# Patient Record
Sex: Female | Born: 1950 | Race: White | Hispanic: No | Marital: Single | Smoking: Never smoker
Health system: Southern US, Community
[De-identification: ages and names within clinical notes are randomized; demographics above are authoritative.]

## PROBLEM LIST (undated history)

## (undated) DIAGNOSIS — J45909 Unspecified asthma, uncomplicated: Secondary | ICD-10-CM

---

## 2014-09-25 ENCOUNTER — Encounter: Payer: Self-pay | Admitting: *Deleted

## 2014-09-25 ENCOUNTER — Ambulatory Visit
Admission: EM | Admit: 2014-09-25 | Discharge: 2014-09-25 | Disposition: A | Discharge status: Home / Self Care | Payer: No Typology Code available for payment source | Attending: Family Medicine | Admitting: Family Medicine

## 2014-09-25 DIAGNOSIS — H6091 Unspecified otitis externa, right ear: Secondary | ICD-10-CM

## 2014-09-25 DIAGNOSIS — J0100 Acute maxillary sinusitis, unspecified: Secondary | ICD-10-CM | POA: Diagnosis not present

## 2014-09-25 HISTORY — DX: Unspecified asthma, uncomplicated: J45.909

## 2014-09-25 MED ORDER — CIPROFLOXACIN-DEXAMETHASONE 0.3-0.1 % OT SUSP: 4.0000 [drp] | Freq: Two times a day (BID) | OTIC | Status: AC

## 2014-09-25 MED ORDER — DOXYCYCLINE HYCLATE 100 MG PO CAPS: 100.0000 mg | ORAL_CAPSULE | Freq: Two times a day (BID) | ORAL | Status: AC

## 2014-09-25 NOTE — ED Provider Notes (Signed)
CSN: [id number]     Arrival date & time 09/25/14  1925 History   First MD Initiated Contact with Patient 09/25/14 2004     Chief Complaint  Patient presents with  . Otalgia   (Consider location/radiation/quality/duration/timing/severity/associated sxs/prior Treatment) HPI  Patient presents for evaluation of right ear pain, which has been present for the past 2 days and is gradually worsening. Pain is worsened by pushing and pulling the ear. She also reports a slight decrease in hearing. She also complains of a 10 day history of sinus pain and sinus drainage. She denies any fever or chills. She has a history of recurrent sinus infections.  Past Medical History  Diagnosis Date  . Asthma    Past Surgical History  Procedure Laterality Date  . Cesarean section     No family history on file. History  Substance Use Topics  . Smoking status: Never Smoker   . Smokeless tobacco: Not on file  . Alcohol Use: No   OB History    No data available     Review of Systems  Constitutional: Negative for fever and chills.  HENT: Positive for congestion, ear pain and sinus pressure.   Respiratory: Negative for cough and shortness of breath.   Cardiovascular: Negative for chest pain.  Skin: Negative for rash.  Neurological: Positive for headaches.    Allergies  Cephalosporins; Penicillins; Sulfa antibiotics; and Tape  Home Medications   Prior to Admission medications   Medication Sig Start Date End Date Taking? Authorizing Provider  albuterol (PROVENTIL HFA;VENTOLIN HFA) 108 (90 BASE) MCG/ACT inhaler Inhale into the lungs every 6 (six) hours as needed for wheezing or shortness of breath.   Yes Historical Provider, MD  citalopram (CELEXA) 40 MG tablet Take 40 mg by mouth daily.   Yes Historical Provider, MD  montelukast (SINGULAIR) 10 MG tablet Take 10 mg by mouth at bedtime.   Yes Historical Provider, MD  Pseudoephedrine-Guaifenesin 30-200 MG CAPS Take by mouth.   Yes Historical Provider,  MD  TEMAZEPAM PO Take by mouth.   Yes Historical Provider, MD  zolpidem (AMBIEN) 10 MG tablet Take 10 mg by mouth at bedtime as needed for sleep.   Yes Historical Provider, MD  ciprofloxacin-dexamethasone (CIPRODEX) otic suspension Place 4 drops into the right ear 2 (two) times daily. 09/25/14   Carlean Purl, PA-C  doxycycline (VIBRAMYCIN) 100 MG capsule Take 1 capsule (100 mg total) by mouth 2 (two) times daily. 09/25/14   Carlean Purl, PA-C   BP 134/71 mmHg  Pulse 91  Temp(Src) 98 F (36.7 C) (Oral)  Ht  (1.575 m)  Wt 194 lb (87.998 kg)  BMI 35.47 kg/m2  SpO2 97% Physical Exam  Constitutional: She is oriented to person, place, and time. She appears well-developed and well-nourished.  HENT:  Right Ear: There is swelling and tenderness. A middle ear effusion is present.  Nose: Right sinus exhibits maxillary sinus tenderness and frontal sinus tenderness. Left sinus exhibits maxillary sinus tenderness and frontal sinus tenderness.  Cardiovascular: Normal rate and regular rhythm.   Pulmonary/Chest: Effort normal and breath sounds normal.  Neurological: She is alert and oriented to person, place, and time.  Skin: Skin is warm and dry.  Psychiatric: She has a normal mood and affect. Her behavior is normal.    ED Course  Procedures (including critical care time) Labs Review Labs Reviewed - No data to display  Imaging Review No results found.   MDM   1. Otitis externa of right  ear   2. Acute maxillary sinusitis, recurrence not specified    1. Patient was prescribed Ciprodex for otitis externa and doxycycline for sinusitis. 2. She will follow-up as needed.    Carlean Purlicole L Agatha Duplechain, PA-C 09/25/14 2013

## 2014-09-25 NOTE — ED Notes (Signed)
Pt states "right earache, had a cold about 2 weeks ago, i sleep on my right ear, my head is stopped up off and on all week, ear started hurting about the beginning of the week"

## 2017-06-04 ENCOUNTER — Other Ambulatory Visit: Payer: Self-pay | Admitting: Orthopedic Surgery

## 2017-06-04 DIAGNOSIS — S52134D Nondisplaced fracture of neck of right radius, subsequent encounter for closed fracture with routine healing: Secondary | ICD-10-CM

## 2017-06-22 ENCOUNTER — Ambulatory Visit: Payer: No Typology Code available for payment source

## 2017-06-23 ENCOUNTER — Ambulatory Visit
Admission: RE | Admit: 2017-06-23 | Discharge: 2017-06-23 | Disposition: A | Discharge status: Home / Self Care | Payer: Medicare HMO | Source: Ambulatory Visit | Attending: Orthopedic Surgery | Admitting: Orthopedic Surgery

## 2017-06-23 DIAGNOSIS — M7711 Lateral epicondylitis, right elbow: Secondary | ICD-10-CM | POA: Diagnosis not present

## 2017-06-23 DIAGNOSIS — X58XXXD Exposure to other specified factors, subsequent encounter: Secondary | ICD-10-CM | POA: Insufficient documentation

## 2017-06-23 DIAGNOSIS — M25521 Pain in right elbow: Secondary | ICD-10-CM | POA: Insufficient documentation

## 2017-06-23 DIAGNOSIS — M7521 Bicipital tendinitis, right shoulder: Secondary | ICD-10-CM | POA: Insufficient documentation

## 2017-06-23 DIAGNOSIS — S52134D Nondisplaced fracture of neck of right radius, subsequent encounter for closed fracture with routine healing: Secondary | ICD-10-CM | POA: Insufficient documentation

## 2018-03-25 ENCOUNTER — Other Ambulatory Visit: Payer: Self-pay | Admitting: Internal Medicine

## 2018-03-25 DIAGNOSIS — Z1231 Encounter for screening mammogram for malignant neoplasm of breast: Secondary | ICD-10-CM

## 2018-03-30 ENCOUNTER — Encounter: Payer: Self-pay | Admitting: Radiology

## 2018-03-30 ENCOUNTER — Ambulatory Visit
Admission: RE | Admit: 2018-03-30 | Discharge: 2018-03-30 | Disposition: A | Discharge status: Home / Self Care | Payer: Medicare HMO | Source: Ambulatory Visit | Attending: Internal Medicine | Admitting: Internal Medicine

## 2018-03-30 DIAGNOSIS — Z1231 Encounter for screening mammogram for malignant neoplasm of breast: Secondary | ICD-10-CM | POA: Diagnosis not present

## 2019-04-14 ENCOUNTER — Ambulatory Visit
Admission: EM | Admit: 2019-04-14 | Discharge: 2019-04-14 | Disposition: A | Discharge status: Home / Self Care | Payer: Medicare HMO | Attending: Family Medicine | Admitting: Family Medicine

## 2019-04-14 ENCOUNTER — Ambulatory Visit (INDEPENDENT_AMBULATORY_CARE_PROVIDER_SITE_OTHER): Payer: Medicare HMO

## 2019-04-14 ENCOUNTER — Other Ambulatory Visit: Payer: Self-pay

## 2019-04-14 DIAGNOSIS — S7001XA Contusion of right hip, initial encounter: Secondary | ICD-10-CM

## 2019-04-14 DIAGNOSIS — R0789 Other chest pain: Secondary | ICD-10-CM

## 2019-04-14 DIAGNOSIS — M25551 Pain in right hip: Secondary | ICD-10-CM

## 2019-04-14 DIAGNOSIS — M25559 Pain in unspecified hip: Secondary | ICD-10-CM

## 2019-04-14 DIAGNOSIS — W01198A Fall on same level from slipping, tripping and stumbling with subsequent striking against other object, initial encounter: Secondary | ICD-10-CM

## 2019-04-14 DIAGNOSIS — W19XXXA Unspecified fall, initial encounter: Secondary | ICD-10-CM

## 2019-04-14 DIAGNOSIS — S20211A Contusion of right front wall of thorax, initial encounter: Secondary | ICD-10-CM

## 2019-04-14 MED ORDER — METAXALONE 800 MG PO TABS: 800.0000 mg | ORAL_TABLET | Freq: Three times a day (TID) | ORAL | 0 refills | Status: AC

## 2019-04-14 MED ORDER — HYDROCODONE-ACETAMINOPHEN 5-325 MG PO TABS: ORAL_TABLET | ORAL | 0 refills | Status: AC

## 2019-04-14 NOTE — ED Triage Notes (Signed)
Pt presents with c/o s/p fall this morning at home. She fell onto an air purifier on the back side of her right hip. She reports pain to her right hip, right ribs and into mid/lower abdomen. She denies any n/v, hematuria or other symptoms. She is able to ambulate but movement is painful.

## 2019-04-14 NOTE — Discharge Instructions (Signed)
Rest, ice, tyelnol/advil as needed

## 2019-04-14 NOTE — ED Provider Notes (Signed)
[facility]    CSN: [id number] Arrival date & time: 04/14/19  1752      History   Chief Complaint Chief Complaint  Patient presents with  . Fall    HPI Alexa Chase is a 69 y.o. female.   69 yo female with a c/o right hip and right rib pain since falling at home this morning. States she tripped in her kitchen and landed onto an air purifier and on the floor. Denies hitting her head or loss of consciousness.      Past Medical History:  Diagnosis Date  . Asthma     There are no problems to display for this patient.   Past Surgical History:  Procedure Laterality Date  . CESAREAN SECTION      OB History   No obstetric history on file.      Home Medications    Prior to Admission medications   Medication Sig Start Date End Date Taking? Authorizing Provider  albuterol (PROVENTIL HFA;VENTOLIN HFA) 108 (90 BASE) MCG/ACT inhaler Inhale into the lungs every 6 (six) hours as needed for wheezing or shortness of breath.   Yes [provider]  citalopram (CELEXA) 40 MG tablet Take 40 mg by mouth daily.   Yes [provider]  Cyanocobalamin 3000 MCG SUBL Place under the tongue.   Yes [provider]  diclofenac (VOLTAREN) 75 MG EC tablet Take by mouth. 03/30/19 03/29/20 Yes [provider]  Eszopiclone 3 MG TABS Take 3 mg by mouth at bedtime as needed. 04/06/19  Yes [provider]  SUMAtriptan (IMITREX) 50 MG tablet 1 prn headache then repeat in 1 hour as needed 05/25/13  Yes [provider]  TEMAZEPAM PO Take by mouth.   Yes [provider]  traZODone (DESYREL) 100 MG tablet Take 100 mg by mouth at bedtime. 03/30/19  Yes [provider]  ciprofloxacin-dexamethasone (CIPRODEX) otic suspension Place 4 drops into the right ear 2 (two) times daily. 09/25/14   Mathews Argyle, PA-C  doxycycline (VIBRAMYCIN) 100 MG capsule Take 1 capsule (100 mg total) by mouth 2 (two) times daily. 09/25/14   Mathews Argyle, PA-C  HYDROcodone-acetaminophen (NORCO/VICODIN) 5-325 MG tablet 1-2 tab po qd prn 04/14/19   Norval Gable, MD  metaxalone (SKELAXIN) 800 MG tablet Take 1 tablet (800 mg total) by mouth 3 (three) times daily. 04/14/19   Norval Gable, MD  montelukast (SINGULAIR) 10 MG tablet Take 10 mg by mouth at bedtime.    [provider]  Pseudoephedrine-Guaifenesin 30-200 MG CAPS Take by mouth.    [provider]  zolpidem (AMBIEN) 10 MG tablet Take 10 mg by mouth at bedtime as needed for sleep.    [provider]    Family History Family History  Problem Relation Age of Onset  . Colon cancer Mother   . Stroke Mother   . Hypertension Mother   . Multiple myeloma Father   . Hypertension Father   . Breast cancer Neg Hx     Social History Social History   Tobacco Use  . Smoking status: Never Smoker  . Smokeless tobacco: Never Used  Substance Use Topics  . Alcohol use: No  . Drug use: No     Allergies   Cephalosporins, Penicillins, Sulfa antibiotics, and Tape   Review of Systems Review of Systems   Physical Exam Triage Vital Signs ED Triage Vitals  Enc Vitals Group     BP 04/14/19 1810 137/73  Pulse Rate 04/14/19 1810 85     Resp --      Temp 04/14/19 1810 98.2 F (36.8 C)     Temp Source 04/14/19 1810 Oral     SpO2 04/14/19 1810 99 %     Weight 04/14/19 1804 201 lb (91.2 kg)     Height 04/14/19 1804 5' 2" (1.575 m)     Head Circumference --      Peak Flow --      Pain Score 04/14/19 1803 8     Pain Loc --      Pain Edu? --      Excl. in [place]? --    No data found.  Updated Vital Signs BP 137/73 (BP Location: Left Arm)   Pulse 85   Temp 98.2 F (36.8 C) (Oral)   Ht 5' 2" (1.575 m)   Wt 91.2 kg   SpO2 99%   BMI 36.76 kg/m   Visual Acuity Right Eye Distance:   Left Eye Distance:   Bilateral Distance:    Right Eye Near:   Left Eye Near:    Bilateral Near:     Physical Exam Vitals and nursing note reviewed.    Constitutional:      General: She is not in acute distress.    Appearance: She is not toxic-appearing or diaphoretic.  Cardiovascular:     Rate and Rhythm: Normal rate.  Pulmonary:     Effort: Pulmonary effort is normal. No respiratory distress.     Breath sounds: Normal breath sounds.  Abdominal:     General: Bowel sounds are normal. There is no distension.     Palpations: Abdomen is soft.     Tenderness: There is no abdominal tenderness.  Musculoskeletal:     Right hip: Tenderness and bony tenderness present. No deformity, lacerations or crepitus.     Comments: Right lower extremity neurovascularly intact  Neurological:     Mental Status: She is alert.      UC Treatments / Results  Labs (all labs ordered are listed, but only abnormal results are displayed) Labs Reviewed - No data to display  EKG   Radiology DG Ribs Unilateral W/Chest Right  Result Date: 04/14/2019 CLINICAL DATA:  Pain after fall EXAM: RIGHT RIBS AND CHEST - 3+ VIEW COMPARISON:  None. FINDINGS: No fracture or other bone lesions are seen involving the ribs. There is no evidence of pneumothorax or pleural effusion. Both lungs are clear. Heart size and mediastinal contours are within normal limits. IMPRESSION: Negative. Electronically Signed   By: Dorise Bullion III M.D   On: 04/14/2019 19:45   DG Hip Unilat With Pelvis 2-3 Views Right  Result Date: 04/14/2019 CLINICAL DATA:  Fall this morning. EXAM: DG HIP (WITH OR WITHOUT PELVIS) 2-3V RIGHT COMPARISON:  None. FINDINGS: There is no evidence of hip fracture or dislocation. There is no evidence of arthropathy or other focal bone abnormality. IMPRESSION: Negative. Electronically Signed   By: Dorise Bullion III M.D   On: 04/14/2019 19:44    Procedures Procedures (including critical care time)  Medications Ordered in UC Medications - No data to display  Initial Impression / Assessment and Plan / UC Course  I have reviewed the triage vital signs and the  nursing notes.  Pertinent labs & imaging results that were available during my care of the patient were reviewed by me and considered in my medical decision making (see chart for details).      Final Clinical Impressions(s) / UC  Diagnoses   Final diagnoses:  Hip pain  Fall, initial encounter  Contusion of right hip, initial encounter  Contusion of right chest wall, initial encounter     Discharge Instructions     Rest, ice, tyelnol/advil as needed    ED Prescriptions    Medication Sig Dispense Auth. Provider   metaxalone (SKELAXIN) 800 MG tablet Take 1 tablet (800 mg total) by mouth 3 (three) times daily. 21 tablet Norval Gable, MD   HYDROcodone-acetaminophen (NORCO/VICODIN) 5-325 MG tablet 1-2 tab po qd prn 6 tablet Norval Gable, MD      1. x-ray results and diagnosis reviewed with patient 2. rx as per orders above; reviewed possible side effects, interactions, risks and benefits  3. Recommend supportive treatment as above 4. Follow-up prn if symptoms worsen or don't improve  I have reviewed the PDMP during this encounter.   Norval Gable, MD 04/16/19 1510

## 2019-09-05 ENCOUNTER — Other Ambulatory Visit: Payer: Self-pay | Admitting: Family Medicine

## 2019-09-05 DIAGNOSIS — M5116 Intervertebral disc disorders with radiculopathy, lumbar region: Secondary | ICD-10-CM

## 2019-09-21 ENCOUNTER — Other Ambulatory Visit: Payer: Self-pay

## 2019-09-21 ENCOUNTER — Ambulatory Visit
Admission: RE | Admit: 2019-09-21 | Discharge: 2019-09-21 | Disposition: A | Discharge status: Home / Self Care | Payer: Medicare HMO | Source: Ambulatory Visit | Attending: Family Medicine | Admitting: Family Medicine

## 2019-09-21 DIAGNOSIS — M5116 Intervertebral disc disorders with radiculopathy, lumbar region: Secondary | ICD-10-CM | POA: Diagnosis present

## 2020-01-18 ENCOUNTER — Other Ambulatory Visit: Payer: Self-pay

## 2020-01-18 ENCOUNTER — Other Ambulatory Visit: Payer: Self-pay | Admitting: Internal Medicine

## 2020-01-18 ENCOUNTER — Ambulatory Visit
Admission: RE | Admit: 2020-01-18 | Discharge: 2020-01-18 | Disposition: A | Discharge status: Home / Self Care | Payer: Medicare HMO | Source: Ambulatory Visit | Attending: Internal Medicine | Admitting: Internal Medicine

## 2020-01-18 DIAGNOSIS — S0990XA Unspecified injury of head, initial encounter: Secondary | ICD-10-CM | POA: Diagnosis not present

## 2021-04-14 IMAGING — CT CT HEAD W/O CM
3 series · 16 of 47 positions shown, 19 images · non-contrast
Comparison: None.

CLINICAL DATA: Fall 2 weeks ago. Headache and dizziness. Head
injury.

EXAM:
CT HEAD WITHOUT CONTRAST
TECHNIQUE: Contiguous axial images were obtained from the base of the skull
through the vertex without intravenous contrast.

[Series 2: head wo · axial · 0.40mm/px · z∈[+891,+1016]mm · 10 of 30 slices shown, 13 images]
[im 3/30  brain]
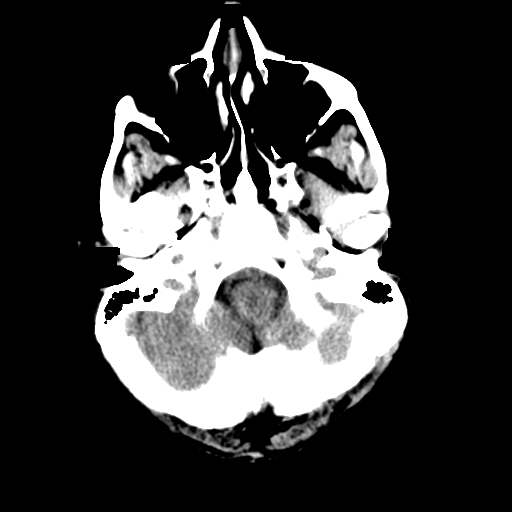
[im 3/30  bone]
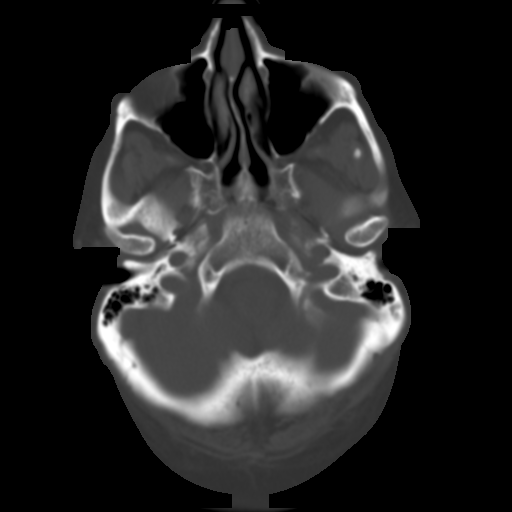
[im 6/30  brain]
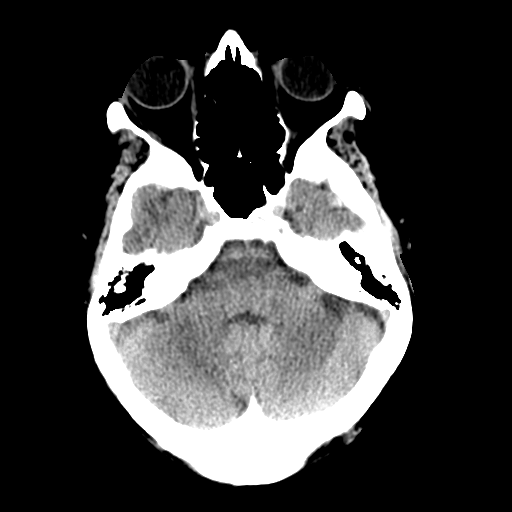
[im 9/30  brain]
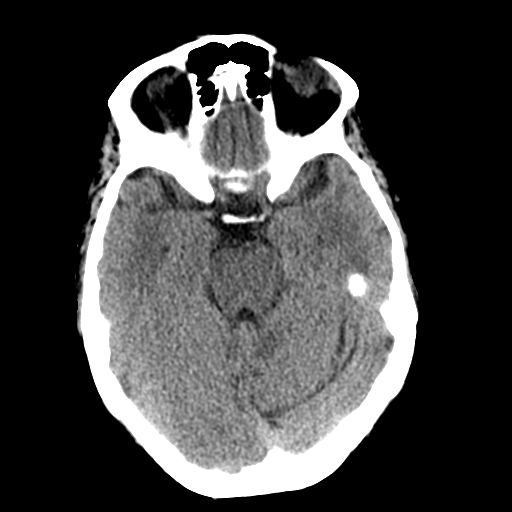
[im 11/30  brain]
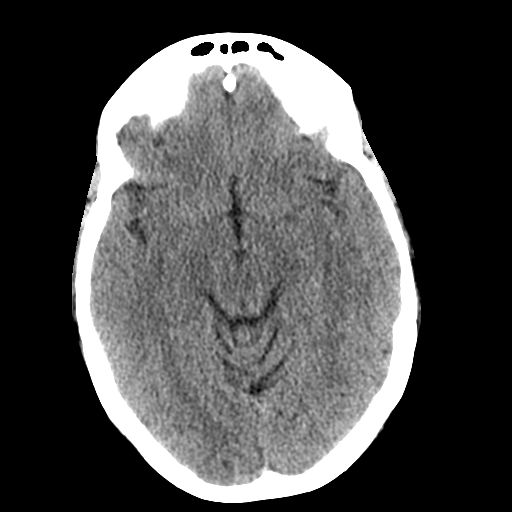
[im 14/30  brain]
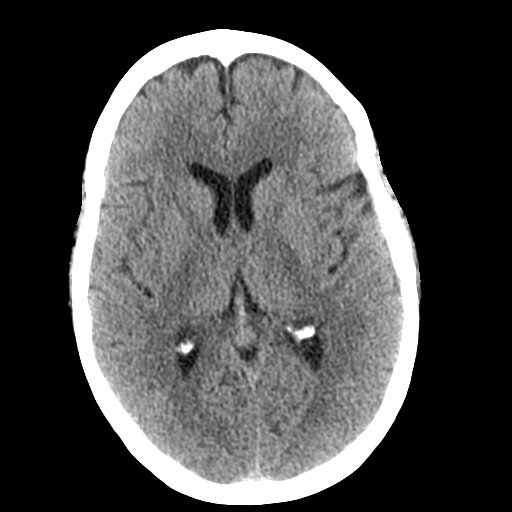
[im 14/30  bone]
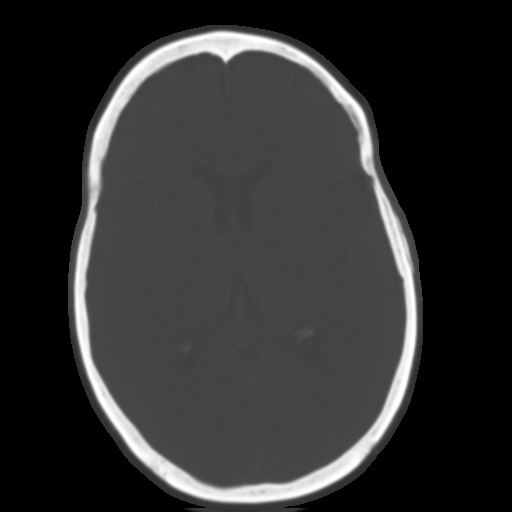
[im 17/30  brain]
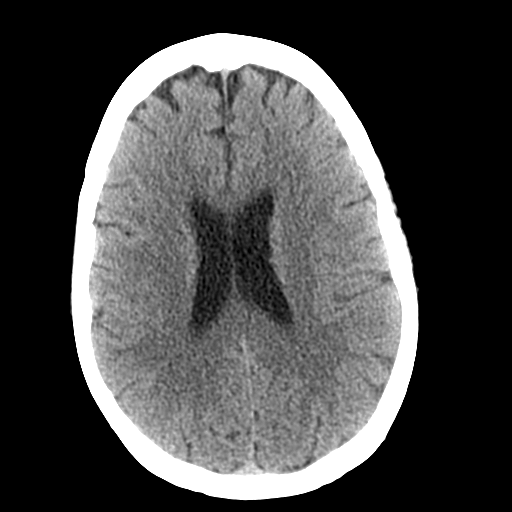
[im 20/30  brain]
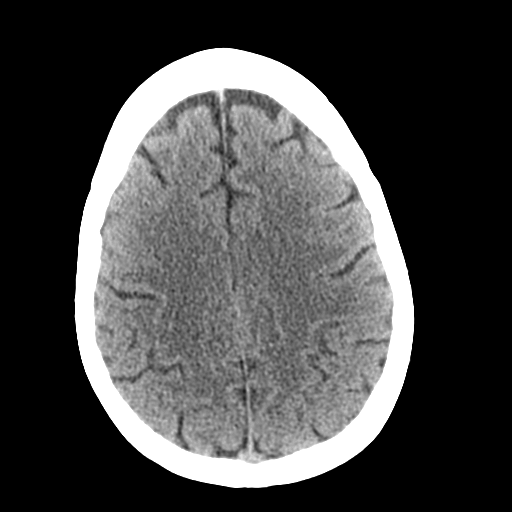
[im 23/30  brain]
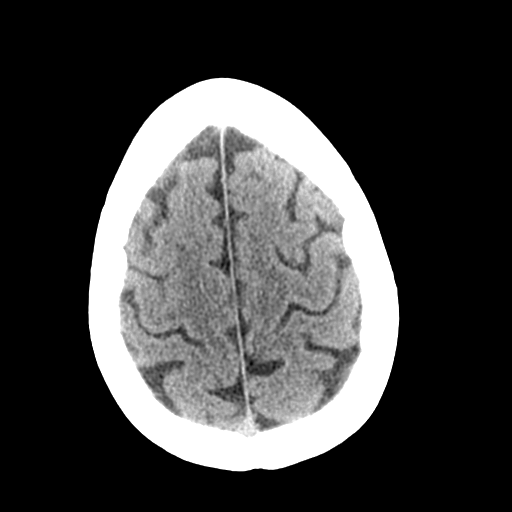
[im 25/30  brain]
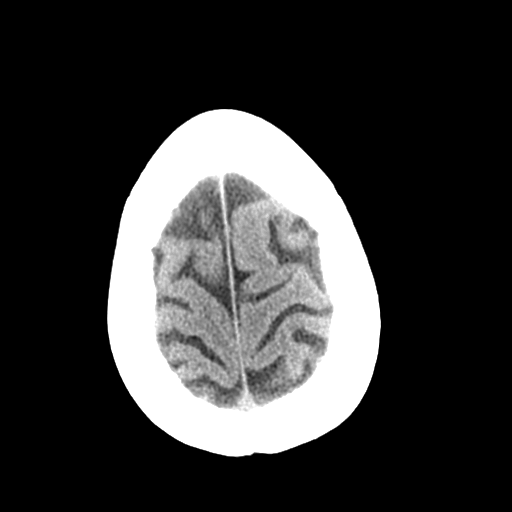
[im 25/30  bone]
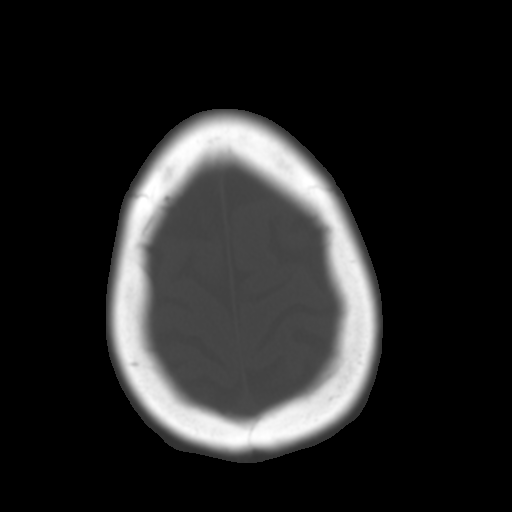
[im 28/30  brain]
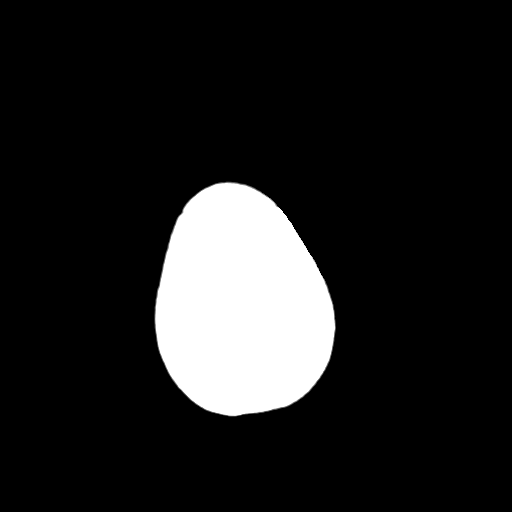

[Series 4: coronal soft tissue · coronal · 0.30mm/px · 3 of 72 slices shown]
[im 26/72  brain]
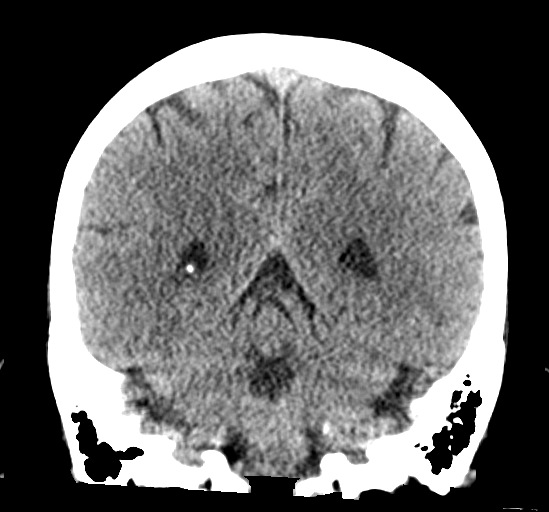
[im 33/72  brain]
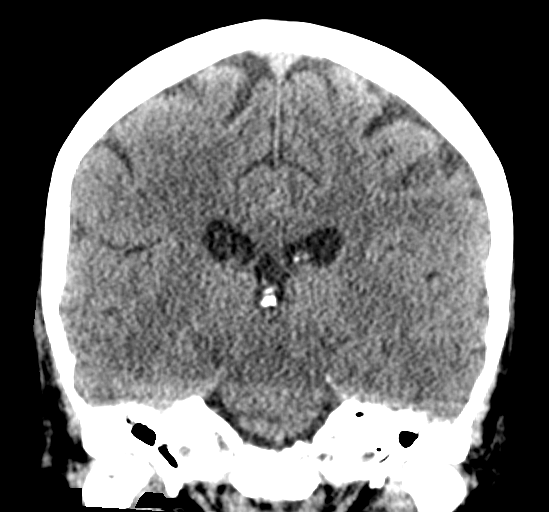
[im 40/72  brain]
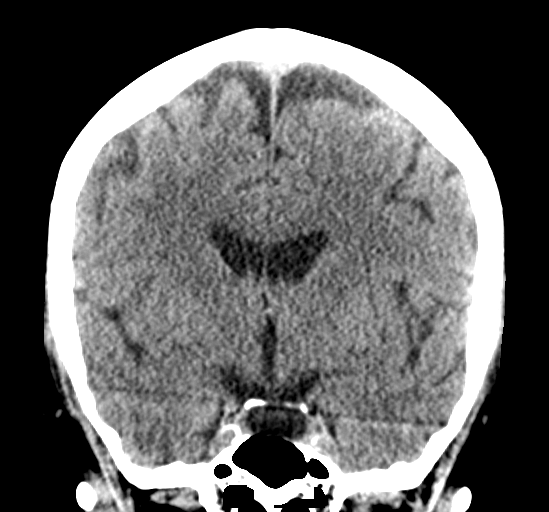

[Series 5: sagittal soft tissue · sagittal · 0.30mm/px · 3 of 54 slices shown]
[im 18/54  brain]
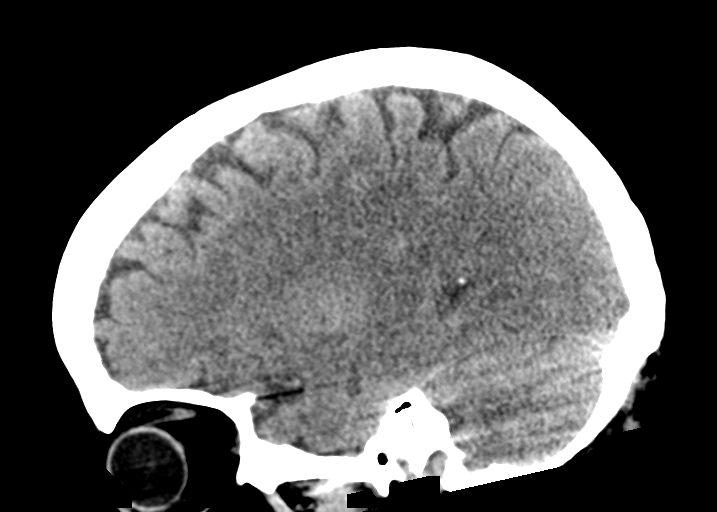
[im 27/54  brain]
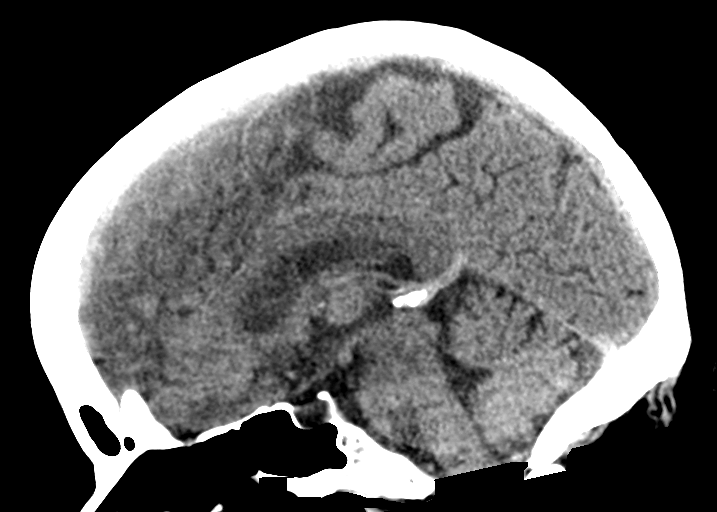
[im 36/54  brain]
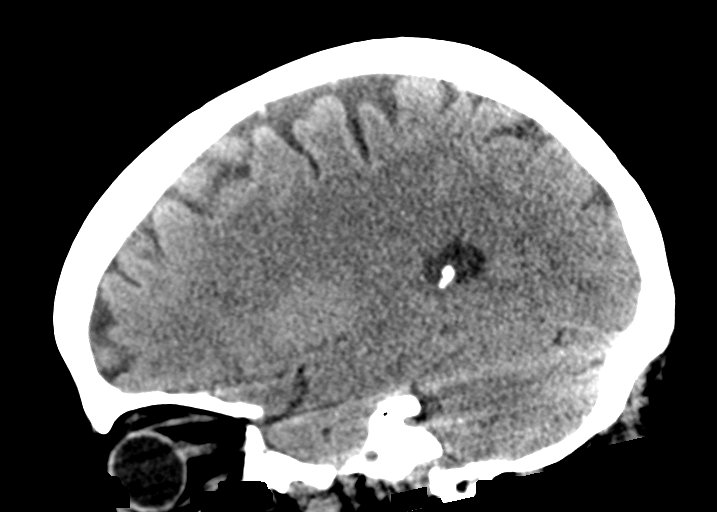

[16 of 47 positions shown; findings below may reference images not displayed]

FINDINGS: Brain: No evidence of acute infarction, hemorrhage, hydrocephalus,
extra-axial collection or mass lesion/mass effect.

Vascular: Negative for hyperdense vessel

Skull: Negative

Sinuses/Orbits: Paranasal sinuses clear. Bilateral cataract
extraction

Other: None
IMPRESSION: Negative CT head

## 2021-04-22 ENCOUNTER — Other Ambulatory Visit: Payer: Self-pay | Admitting: Internal Medicine

## 2021-04-22 DIAGNOSIS — Z1231 Encounter for screening mammogram for malignant neoplasm of breast: Secondary | ICD-10-CM

## 2021-05-27 ENCOUNTER — Ambulatory Visit: Payer: Medicare HMO

## 2021-10-09 ENCOUNTER — Ambulatory Visit
Admission: RE | Admit: 2021-10-09 | Discharge: 2021-10-09 | Disposition: A | Discharge status: Home / Self Care | Payer: Medicare HMO | Source: Ambulatory Visit | Attending: Internal Medicine | Admitting: Internal Medicine

## 2021-10-09 DIAGNOSIS — Z1231 Encounter for screening mammogram for malignant neoplasm of breast: Secondary | ICD-10-CM | POA: Diagnosis present

## 2022-09-11 ENCOUNTER — Other Ambulatory Visit: Payer: Self-pay | Admitting: Orthopedic Surgery

## 2022-09-11 DIAGNOSIS — M25511 Pain in right shoulder: Secondary | ICD-10-CM

## 2022-09-11 DIAGNOSIS — S46001A Unspecified injury of muscle(s) and tendon(s) of the rotator cuff of right shoulder, initial encounter: Secondary | ICD-10-CM

## 2022-09-19 ENCOUNTER — Ambulatory Visit
Admission: RE | Admit: 2022-09-19 | Discharge: 2022-09-19 | Disposition: A | Discharge status: Home / Self Care | Payer: Medicare HMO | Source: Ambulatory Visit | Attending: Orthopedic Surgery | Admitting: Orthopedic Surgery

## 2022-09-19 DIAGNOSIS — S46001A Unspecified injury of muscle(s) and tendon(s) of the rotator cuff of right shoulder, initial encounter: Secondary | ICD-10-CM | POA: Insufficient documentation

## 2022-09-19 DIAGNOSIS — M25511 Pain in right shoulder: Secondary | ICD-10-CM | POA: Diagnosis present

## 2023-08-31 LAB — GLUCOSE, POCT (MANUAL RESULT ENTRY): POC Glucose: 124 mg/dL — AB (ref 70–99)

## 2023-08-31 NOTE — Congregational Nurse Program (Signed)
  Dept: [phone]   Congregational Nurse Program Note  Date of Encounter: 08/31/2023  Past Medical History: Past Medical History:  Diagnosis Date   Asthma     Encounter Details:  Community Questionnaire - 08/31/23 1711       Questionnaire   Ask client: Do you give verbal consent for me to treat you today? Yes    Student Assistance N/A    Location Patient Served  [facility]   [place]   Encounter Setting CN site    Population Status Unknown    Insurance Unknown    Insurance/Financial Assistance Referral N/A    Medication N/A    Medical Provider Yes    Screening Referrals Made N/A    Medical Referrals Made N/A    Medical Appointment Completed N/A    CNP Interventions Advocate/Support    Screenings CN Performed Blood Pressure    ED Visit Averted N/A    Life-Saving Intervention Made N/A          Today's Vitals   08/31/23 1711  BP: 116/68   There is no height or weight on file to calculate BMI.   Patient was educated on eating less sugar and hydrating throughout the day.  Shereda Graw,MSN, RN

## 2023-12-18 ENCOUNTER — Emergency Department

## 2023-12-18 ENCOUNTER — Other Ambulatory Visit: Payer: Self-pay

## 2023-12-18 ENCOUNTER — Emergency Department: Admission: EM | Admit: 2023-12-18 | Discharge: 2023-12-18 | Disposition: A | Discharge status: Home / Self Care

## 2023-12-18 DIAGNOSIS — M25552 Pain in left hip: Secondary | ICD-10-CM | POA: Insufficient documentation

## 2023-12-18 DIAGNOSIS — S0101XA Laceration without foreign body of scalp, initial encounter: Secondary | ICD-10-CM | POA: Diagnosis not present

## 2023-12-18 DIAGNOSIS — J45909 Unspecified asthma, uncomplicated: Secondary | ICD-10-CM | POA: Diagnosis not present

## 2023-12-18 DIAGNOSIS — M25512 Pain in left shoulder: Secondary | ICD-10-CM | POA: Insufficient documentation

## 2023-12-18 DIAGNOSIS — S0990XA Unspecified injury of head, initial encounter: Secondary | ICD-10-CM | POA: Diagnosis present

## 2023-12-18 DIAGNOSIS — W01198A Fall on same level from slipping, tripping and stumbling with subsequent striking against other object, initial encounter: Secondary | ICD-10-CM | POA: Diagnosis not present

## 2023-12-18 DIAGNOSIS — M25562 Pain in left knee: Secondary | ICD-10-CM | POA: Insufficient documentation

## 2023-12-18 DIAGNOSIS — W19XXXA Unspecified fall, initial encounter: Secondary | ICD-10-CM

## 2023-12-18 LAB — COMPREHENSIVE METABOLIC PANEL WITH GFR
ALT: 8 U/L (ref 0–44)
AST: 22 U/L (ref 15–41)
Albumin: 3.4 g/dL — ABNORMAL LOW (ref 3.5–5.0)
Alkaline Phosphatase: 56 U/L (ref 38–126)
Anion gap: 9 (ref 5–15)
BUN: 22 mg/dL (ref 8–23)
CO2: 24 mmol/L (ref 22–32)
Calcium: 8.9 mg/dL (ref 8.9–10.3)
Chloride: 106 mmol/L (ref 98–111)
Creatinine, Ser: 0.7 mg/dL (ref 0.44–1.00)
GFR, Estimated: >60 mL/min (ref >60)
Glucose, Bld: 128 mg/dL — ABNORMAL HIGH (ref 70–99)
Potassium: 4.1 mmol/L (ref 3.5–5.1)
Sodium: 139 mmol/L (ref 135–145)
Total Bilirubin: 0.4 mg/dL (ref 0.0–1.2)
Total Protein: 6.5 g/dL (ref 6.5–8.1)

## 2023-12-18 LAB — CBC WITH DIFFERENTIAL/PLATELET
Abs Immature Granulocytes: 0.02 K/uL (ref 0.00–0.07)
Basophils Absolute: 0 K/uL (ref 0.0–0.1)
Basophils Relative: 1 %
Eosinophils Absolute: 0.1 K/uL (ref 0.0–0.5)
Eosinophils Relative: 2 %
HCT: 36.2 % (ref 36.0–46.0)
Hemoglobin: 12 g/dL (ref 12.0–15.0)
Immature Granulocytes: 0 %
Lymphocytes Relative: 21 %
Lymphs Abs: 1.3 K/uL (ref 0.7–4.0)
MCH: 31.9 pg (ref 26.0–34.0)
MCHC: 33.1 g/dL (ref 30.0–36.0)
MCV: 96.3 fL (ref 80.0–100.0)
Monocytes Absolute: 0.5 K/uL (ref 0.1–1.0)
Monocytes Relative: 8 %
Neutro Abs: 4.2 K/uL (ref 1.7–7.7)
Neutrophils Relative %: 68 %
Platelets: 254 K/uL (ref 150–400)
RBC: 3.76 MIL/uL — ABNORMAL LOW (ref 3.87–5.11)
RDW: 13.2 % (ref 11.5–15.5)
WBC: 6.1 K/uL (ref 4.0–10.5)
nRBC: 0 % (ref 0.0–0.2)

## 2023-12-18 LAB — URINALYSIS, COMPLETE (UACMP) WITH MICROSCOPIC
Bacteria, UA: NONE SEEN
Bilirubin Urine: NEGATIVE
Glucose, UA: NEGATIVE mg/dL
Hgb urine dipstick: NEGATIVE
Ketones, ur: NEGATIVE mg/dL
Leukocytes,Ua: NEGATIVE
Nitrite: NEGATIVE
Protein, ur: NEGATIVE mg/dL
Specific Gravity, Urine: 1.024 (ref 1.005–1.030)
pH: 5 (ref 5.0–8.0)

## 2023-12-18 MED ORDER — OXYCODONE HCL 5 MG PO TABS
5.0000 mg | ORAL_TABLET | Freq: Once | ORAL | Status: AC
  Administered 2023-12-18: 5 mg via ORAL
  Filled 2023-12-18: qty 1

## 2023-12-18 MED ORDER — ACETAMINOPHEN 500 MG PO TABS
1000.0000 mg | ORAL_TABLET | Freq: Once | ORAL | Status: AC
Start: 2023-12-18 — End: 2023-12-18
  Administered 2023-12-18: 1000 mg via ORAL
  Filled 2023-12-18: qty 2

## 2023-12-18 NOTE — ED Triage Notes (Addendum)
 Pt to ed from home via POV for a fall that happened earlier this morning. Pt was stepping on a stool to get into bed and the stool gave way and she fell. Hit her head on the night stand. Pt denies LOC and denies thinners. Pt is caox4, in no acute distress in triage. Pt has small laceration/cut on posterior side of scalp with no bleeding at this time. Occurred at 0630 AM. Pt has a headache, left sided shoulder, knee and hip pain.

## 2023-12-18 NOTE — ED Provider Notes (Signed)
 [facility] Provider Note    Event Date/Time   First MD Initiated Contact with Patient 12/18/23 1603     (approximate)   History   Fall  Pt to ed from home via POV for a fall that happened earlier this morning. Pt was stepping on a stool to get into bed and the stool gave way and she fell. Hit her head on the night stand. Pt denies LOC and denies thinners. Pt is caox4, in no acute distress in triage. Pt has small laceration/cut on posterior side of scalp with no bleeding at this time. Occurred at 0630 AM. Pt has a headache, left sided shoulder, knee and hip pain.   HPI Alexa Chase is a 73 y.o. female with asthma, psoriatic arthritis, chronic right shoulder pain presents for evaluation after fall - Patient was getting into bed around 630 this morning and standing on a stool next to her bed which returned, causing her to fall.  To the back of her head on bedside stand.  No loss of consciousness.  Not on blood thinners.  Had difficulty getting up and was on the ground for about 15 minutes before she got help from her husband. -When her daughter came to visit her later in the day, noted patient was speaking a little slower than usual and less precisely so brought her to ED for eval.     Physical Exam   Triage Vital Signs: ED Triage Vitals  Encounter Vitals Group     BP 12/18/23 1540 127/69     Girls Systolic BP Percentile --      Girls Diastolic BP Percentile --      Boys Systolic BP Percentile --      Boys Diastolic BP Percentile --      Pulse Rate 12/18/23 1540 72     Resp 12/18/23 1540 16     Temp 12/18/23 1540 98 F (36.7 C)     Temp Source 12/18/23 1540 Oral     SpO2 12/18/23 1540 98 %     Weight --      Height 12/18/23 1541 5' 2 (1.575 m)     Head Circumference --      Peak Flow --      Pain Score 12/18/23 1541 6     Pain Loc --      Pain Education --      Exclude from Growth Chart --     Most recent vital signs: Vitals:   12/18/23  1540  BP: 127/69  Pulse: 72  Resp: 16  Temp: 98 F (36.7 C)  SpO2: 98%     General: Awake, no distress.  HEENT: Small 0.75 cm laceration to occiput, not full-thickness.  Hemostatic, not contaminated.  No midline neck pain. CV:  Good peripheral perfusion. RRR, RP 2+ Resp:  Normal effort. CTAB Abd:  No distention. Nontender to deep palpation throughout Other:  Some tenderness palpation over left upper arm/shoulder as well as left knee.  Knee with possible mild effusion though no deformity and no wounds.  All other extremities with full range of motion and no tenderness to palpation throughout.   ED Results / Procedures / Treatments   Labs (all labs ordered are listed, but only abnormal results are displayed) Labs Reviewed  CBC WITH DIFFERENTIAL/PLATELET - Abnormal; Notable for the following components:      Result Value   RBC 3.76 (*)    All other components within normal limits  COMPREHENSIVE METABOLIC  PANEL WITH GFR - Abnormal; Notable for the following components:   Glucose, Bld 128 (*)    Albumin 3.4 (*)    All other components within normal limits  URINALYSIS, COMPLETE (UACMP) WITH MICROSCOPIC - Abnormal; Notable for the following components:   Color, Urine YELLOW (*)    APPearance CLEAR (*)    All other components within normal limits     EKG  N/a   RADIOLOGY Radiology interpreted myself and radiology reports reviewed.  No acute pathology identified.    PROCEDURES:  Critical Care performed: No  Procedures   MEDICATIONS ORDERED IN ED: Medications  acetaminophen  (TYLENOL ) tablet 1,000 mg (has no administration in time range)  oxyCODONE (Oxy IR/ROXICODONE) immediate release tablet 5 mg (has no administration in time range)     IMPRESSION / MDM / ASSESSMENT AND PLAN / ED COURSE  I reviewed the triage vital signs and the nursing notes.                              DDX/MDM/AP: Differential diagnosis includes, but is not limited to, current mechanical  fall.  Consider possibility of skull fracture, intracranial hemorrhage, C-spine fracture.  Also consider proximal humeral fracture or shoulder dislocation, unlikely left knee or hip fracture.  Consider ligamentous injury or meniscal injury in left knee.  Possible mild concussion given mild slowing of words reported earlier though patient seems to be mentating appropriately on my eval.  Plan: - Declines laceration pair given very small size and hemostatic, states Tdap up-to-date - Usually takes oxycodone at home for chronic psoriatic arthritis pain.  Will give oxycodone and Tylenol . - CT head, C-spine - X-ray left shoulder, hip, knee - Basic screening labs  Patient's presentation is most consistent with acute presentation with potential threat to life or bodily function.  The patient is on the cardiac monitor to evaluate for evidence of arrhythmia and/or significant heart rate changes.  ED course below.  Workup unremarkable with no significant traumatic injuries.  No evidence of underlying UTI or other organic contributor to fall.  Stable for outpatient follow-up with PMD.  Knee brace provided.  ED return precautions placed.  Patient agrees with plan.  Clinical Course as of 12/18/23 1718  Sat Dec 18, 2023  1625 CBC, CMP reviewed, unremarkable [MM]  1627 CT head interpreted by myself and radiology report reviewed.  No acute pathology identified. [MM]  1650 Urinalysis with no evidence of infection [MM]  1650 CT C-spine: IMPRESSION: 1. No acute fracture or subluxation in the cervical spine. 2. Moderate to marked severity multilevel degenerative changes, most prominent at the levels of C5-C6 and C6-C7.   [MM]  1656 XR L hip: IMPRESSION: No acute fracture, pelvic bone diastasis, or dislocation.   [MM]  1708 XR L shoulder: IMPRESSION: No acute fracture or dislocation.   [MM]  1708 XR L knee: IMPRESSION: 1. No acute fracture or dislocation. 2. Mild osteoarthritis of the knee.   [MM]     Clinical Course User Index [MM] Clarine Ozell LABOR, MD     FINAL CLINICAL IMPRESSION(S) / ED DIAGNOSES   Final diagnoses:  Fall, initial encounter  Laceration of scalp, initial encounter  Acute pain of left knee  Acute pain of left shoulder     Rx / DC Orders   ED Discharge Orders     None        Note:  This document was prepared using Dragon voice recognition software and  may include unintentional dictation errors.   Clarine Ozell LABOR, MD 12/18/23 [phone]

## 2023-12-18 NOTE — Discharge Instructions (Signed)
 Your evaluation in the emergency department is overall reassuring, we saw no traumatic injuries from your fall.  Please continue your usual pain medication regimen at home and follow-up with your primary care provider for reevaluation.  If you continue to have significant knee pain worse than usual, they can consider referring you to an orthopedist.  We have provided you a knee brace to help with any knee discomfort in the meantime.  Return to the emergency department with any new or worsening symptoms.

## 2023-12-18 NOTE — ED Notes (Signed)
 Pt in scans, xray will bring to room 53.

## 2024-04-20 ENCOUNTER — Other Ambulatory Visit: Payer: Self-pay | Admitting: Internal Medicine

## 2024-04-20 DIAGNOSIS — Z1231 Encounter for screening mammogram for malignant neoplasm of breast: Secondary | ICD-10-CM

## 2024-05-18 ENCOUNTER — Ambulatory Visit
